# Patient Record
Sex: Female | Born: 1995 | Race: White | Hispanic: No | Marital: Single | Smoking: Never smoker
Health system: Southern US, Community
[De-identification: ages and names within clinical notes are randomized; demographics above are authoritative.]

## PROBLEM LIST (undated history)

## (undated) DIAGNOSIS — J45909 Unspecified asthma, uncomplicated: Secondary | ICD-10-CM

## (undated) DIAGNOSIS — R519 Headache, unspecified: Secondary | ICD-10-CM

## (undated) DIAGNOSIS — R51 Headache: Secondary | ICD-10-CM

## (undated) HISTORY — DX: Headache, unspecified: R51.9

## (undated) HISTORY — DX: Headache: R51

---
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
## 2017-11-29 ENCOUNTER — Other Ambulatory Visit: Payer: Self-pay

## 2017-11-29 ENCOUNTER — Encounter (HOSPITAL_COMMUNITY): Payer: Self-pay | Admitting: Emergency Medicine

## 2017-11-29 ENCOUNTER — Emergency Department (HOSPITAL_COMMUNITY)
Admission: EM | Admit: 2017-11-29 | Discharge: 2017-11-30 | Disposition: A | Discharge status: Home / Self Care | Payer: Medicaid Other | Attending: Emergency Medicine | Admitting: Emergency Medicine

## 2017-11-29 DIAGNOSIS — B9689 Other specified bacterial agents as the cause of diseases classified elsewhere: Secondary | ICD-10-CM

## 2017-11-29 DIAGNOSIS — N760 Acute vaginitis: Secondary | ICD-10-CM | POA: Diagnosis not present

## 2017-11-29 DIAGNOSIS — J45909 Unspecified asthma, uncomplicated: Secondary | ICD-10-CM | POA: Insufficient documentation

## 2017-11-29 DIAGNOSIS — B3731 Acute candidiasis of vulva and vagina: Secondary | ICD-10-CM

## 2017-11-29 DIAGNOSIS — N390 Urinary tract infection, site not specified: Secondary | ICD-10-CM

## 2017-11-29 DIAGNOSIS — B373 Candidiasis of vulva and vagina: Secondary | ICD-10-CM | POA: Diagnosis not present

## 2017-11-29 DIAGNOSIS — R1030 Lower abdominal pain, unspecified: Secondary | ICD-10-CM | POA: Diagnosis present

## 2017-11-29 HISTORY — DX: Unspecified asthma, uncomplicated: J45.909

## 2017-11-29 LAB — URINALYSIS, ROUTINE W REFLEX MICROSCOPIC
Bilirubin Urine: NEGATIVE
GLUCOSE, UA: NEGATIVE mg/dL
Ketones, ur: NEGATIVE mg/dL
Nitrite: NEGATIVE
PH: 7 (ref 5.0–8.0)
Protein, ur: NEGATIVE mg/dL
Specific Gravity, Urine: 1.012 (ref 1.005–1.030)

## 2017-11-29 LAB — COMPREHENSIVE METABOLIC PANEL
ALK PHOS: 89 U/L (ref 38–126)
ALT: 17 U/L (ref 0–44)
AST: 20 U/L (ref 15–41)
Albumin: 4.1 g/dL (ref 3.5–5.0)
Anion gap: 11 (ref 5–15)
BILIRUBIN TOTAL: 0.5 mg/dL (ref 0.3–1.2)
BUN: 13 mg/dL (ref 6–20)
CALCIUM: 9 mg/dL (ref 8.9–10.3)
CHLORIDE: 105 mmol/L (ref 98–111)
CO2: 23 mmol/L (ref 22–32)
CREATININE: 0.85 mg/dL (ref 0.44–1.00)
Glucose, Bld: 105 mg/dL — ABNORMAL HIGH (ref 70–99)
Potassium: 3.8 mmol/L (ref 3.5–5.1)
Sodium: 139 mmol/L (ref 135–145)
Total Protein: 7.3 g/dL (ref 6.5–8.1)

## 2017-11-29 LAB — LIPASE, BLOOD: LIPASE: 31 U/L (ref 11–51)

## 2017-11-29 LAB — CBC
HCT: 42.4 % (ref 36.0–46.0)
Hemoglobin: 13.5 g/dL (ref 12.0–15.0)
MCH: 26.6 pg (ref 26.0–34.0)
MCHC: 31.8 g/dL (ref 30.0–36.0)
MCV: 83.6 fL (ref 78.0–100.0)
PLATELETS: 377 10*3/uL (ref 150–400)
RBC: 5.07 MIL/uL (ref 3.87–5.11)
RDW: 16 % — AB (ref 11.5–15.5)
WBC: 15.2 10*3/uL — AB (ref 4.0–10.5)

## 2017-11-29 LAB — I-STAT BETA HCG BLOOD, ED (MC, WL, AP ONLY): I-stat hCG, quantitative: <5 m[IU]/mL (ref <5)

## 2017-11-29 NOTE — ED Triage Notes (Signed)
C/o L flank pain x 3 weeks.  Seen at [facility] ED and told she had pulled muscle.  States pain has not improved and now radiates around to both sides of abd.  Nausea and vomiting today.  Also reports frequent urination and burning with urination.

## 2017-11-30 LAB — GC/CHLAMYDIA PROBE AMP (~~LOC~~) NOT AT ARMC
Chlamydia: NEGATIVE
NEISSERIA GONORRHEA: NEGATIVE

## 2017-11-30 LAB — WET PREP, GENITAL
Sperm: NONE SEEN
TRICH WET PREP: NONE SEEN

## 2017-11-30 MED ORDER — METRONIDAZOLE 0.75 % VA GEL: 1.0000 | Freq: Two times a day (BID) | VAGINAL | 0 refills | Status: AC

## 2017-11-30 MED ORDER — CEPHALEXIN 250 MG PO CAPS
500.0000 mg | ORAL_CAPSULE | Freq: Once | ORAL | Status: AC
  Administered 2017-11-30: 500 mg via ORAL
  Filled 2017-11-30: qty 2

## 2017-11-30 MED ORDER — CEPHALEXIN 500 MG PO CAPS: 500.0000 mg | ORAL_CAPSULE | Freq: Four times a day (QID) | ORAL | 0 refills | Status: AC

## 2017-11-30 MED ORDER — FLUCONAZOLE 150 MG PO TABS: 150.0000 mg | ORAL_TABLET | Freq: Every day | ORAL | 1 refills | Status: AC

## 2017-11-30 NOTE — ED Provider Notes (Signed)
[facility]   CSN: [id number] Arrival date & time: 11/29/17  2142     History   Chief Complaint Chief Complaint  Patient presents with  . Flank Pain    HPI Alicia Ward is a 22 y.o. female.  Patient presents with pain that started in the left flank 3 weeks ago. In the last 1-2 days, the pain extended across lower abdomen and right flank. No fever. She vomited x 1 today without persistent nausea. She has urinary symptoms of dysuria and urinary frequency. She denies vaginal discharge. She has been able to eat and drink per her usual.   The history is provided by the patient. No language interpreter was used.    Past Medical History:  Diagnosis Date  . Asthma   . Headache     Patient Active Problem List   Diagnosis Date Noted  . Migraine with aura and without status migrainosus, not intractable 08/02/2014    Past Surgical History:  Procedure Laterality Date  . APPENDECTOMY  2013     OB History   None      Home Medications    Prior to Admission medications   Medication Sig Start Date End Date Taking? Authorizing Provider  norethindrone (MICRONOR,CAMILA,ERRIN) 0.35 MG tablet Take 1 tablet by mouth daily. 11/10/17  Yes [provider]  levETIRAcetam (KEPPRA) 250 MG tablet Take 1 tablet (250 mg total) by mouth 2 (two) times daily. Patient not taking: Reported on 11/30/2017 09/27/14 12/01/26  Anson FretAhern, Antonia B, MD  nortriptyline (PAMELOR) 10 MG capsule Take 2 capsules (20 mg total) by mouth at bedtime. Patient not taking: Reported on 11/30/2017 12/11/14   Anson FretAhern, Antonia B, MD  SUMAtriptan Succinate (ONZETRA XSAIL) 11 [facility] sprays into the nose once. Patient not taking: Reported on 11/30/2017 11/09/14   Anson FretAhern, Antonia B, MD  zolmitriptan (ZOMIG) 5 MG nasal solution Place 1 spray into the nose as needed for migraine. Patient not taking: Reported on 11/30/2017 11/09/14   Anson FretAhern, Antonia B, MD     Family History Family History  Problem Relation Age of Onset  . Migraines Neg Hx     Social History Social History   Tobacco Use  . Smoking status: Never Smoker  . Smokeless tobacco: Never Used  Substance Use Topics  . Alcohol use: No    Alcohol/week: 0.0 standard drinks  . Drug use: No     Allergies   Patient has no known allergies.   Review of Systems Review of Systems  Constitutional: Negative for fever.  Gastrointestinal: Positive for abdominal pain, nausea and vomiting. Negative for diarrhea.  Genitourinary: Positive for dysuria, flank pain and frequency. Negative for vaginal bleeding and vaginal discharge.  Musculoskeletal: Negative for myalgias.  Neurological: Negative for weakness.     Physical Exam Updated Vital Signs BP (!) 140/101   Pulse 94   Temp 99 F (37.2 C) (Oral)   Resp 16   SpO2 99%   Physical Exam  Constitutional: She is oriented to person, place, and time. She appears well-developed and well-nourished. No distress.  HENT:  Head: Normocephalic.  Neck: Normal range of motion. Neck supple.  Cardiovascular: Normal rate and regular rhythm.  Pulmonary/Chest: Effort normal and breath sounds normal.  Abdominal: Soft. Bowel sounds are normal. There is no tenderness. There is no rebound and no guarding.  Genitourinary:  Genitourinary Comments: Left flank CVA tenderness. There is white vaginal discharge in clumps. No vulvar redness or swelling. No  adnexal tenderness or CMT.   Musculoskeletal: Normal range of motion.  Neurological: She is alert and oriented to person, place, and time.  Skin: Skin is warm and dry. No rash noted.  Psychiatric: She has a normal mood and affect.     ED Treatments / Results  Labs (all labs ordered are listed, but only abnormal results are displayed) Labs Reviewed  WET PREP, GENITAL - Abnormal; Notable for the following components:      Result Value   Yeast Wet Prep HPF POC PRESENT (*)    Clue Cells Wet Prep  HPF POC PRESENT (*)    WBC, Wet Prep HPF POC MANY (*)    All other components within normal limits  COMPREHENSIVE METABOLIC PANEL - Abnormal; Notable for the following components:   Glucose, Bld 105 (*)    All other components within normal limits  CBC - Abnormal; Notable for the following components:   WBC 15.2 (*)    RDW 16.0 (*)    All other components within normal limits  URINALYSIS, ROUTINE W REFLEX MICROSCOPIC - Abnormal; Notable for the following components:   APPearance HAZY (*)    Hgb urine dipstick SMALL (*)    Leukocytes, UA SMALL (*)    Bacteria, UA FEW (*)    All other components within normal limits  LIPASE, BLOOD  I-STAT BETA HCG BLOOD, ED ([facility], [facility], [facility] ONLY)  GC/CHLAMYDIA PROBE AMP ([place]) NOT AT [facility]   Results for orders placed or performed during the hospital encounter of 11/29/17  Wet prep, genital  Result Value Ref Range   Yeast Wet Prep HPF POC PRESENT (A) NONE SEEN   Trich, Wet Prep NONE SEEN NONE SEEN   Clue Cells Wet Prep HPF POC PRESENT (A) NONE SEEN   WBC, Wet Prep HPF POC MANY (A) NONE SEEN   Sperm NONE SEEN   Lipase, blood  Result Value Ref Range   Lipase 31 11 - 51 U/L  Comprehensive metabolic panel  Result Value Ref Range   Sodium 139 135 - 145 mmol/L   Potassium 3.8 3.5 - 5.1 mmol/L   Chloride 105 98 - 111 mmol/L   CO2 23 22 - 32 mmol/L   Glucose, Bld 105 (H) 70 - 99 mg/dL   BUN 13 6 - 20 mg/dL   Creatinine, Ser 1.61 0.44 - 1.00 mg/dL   Calcium 9.0 8.9 - 09.6 mg/dL   Total Protein 7.3 6.5 - 8.1 g/dL   Albumin 4.1 3.5 - 5.0 g/dL   AST 20 15 - 41 U/L   ALT 17 0 - 44 U/L   Alkaline Phosphatase 89 38 - 126 U/L   Total Bilirubin 0.5 0.3 - 1.2 mg/dL   GFR calc non Af Amer >60 >60 mL/min   GFR calc Af Amer >60 >60 mL/min   Anion gap 11 5 - 15  CBC  Result Value Ref Range   WBC 15.2 (H) 4.0 - 10.5 K/uL   RBC 5.07 3.87 - 5.11 MIL/uL   Hemoglobin 13.5 12.0 - 15.0 g/dL   HCT 04.5 40.9 - 81.1 %   MCV 83.6 78.0 - 100.0 fL   MCH 26.6 26.0  - 34.0 pg   MCHC 31.8 30.0 - 36.0 g/dL   RDW 91.4 (H) 78.2 - 95.6 %   Platelets 377 150 - 400 K/uL  Urinalysis, Routine w reflex microscopic  Result Value Ref Range   Color, Urine YELLOW YELLOW   APPearance HAZY (A) CLEAR   Specific Gravity,  Urine 1.012 1.005 - 1.030   pH 7.0 5.0 - 8.0   Glucose, UA NEGATIVE NEGATIVE mg/dL   Hgb urine dipstick SMALL (A) NEGATIVE   Bilirubin Urine NEGATIVE NEGATIVE   Ketones, ur NEGATIVE NEGATIVE mg/dL   Protein, ur NEGATIVE NEGATIVE mg/dL   Nitrite NEGATIVE NEGATIVE   Leukocytes, UA SMALL (A) NEGATIVE   RBC / HPF 11-20 0 - 5 RBC/hpf   WBC, UA 21-50 0 - 5 WBC/hpf   Bacteria, UA FEW (A) NONE SEEN   Squamous Epithelial / LPF 0-5 0 - 5  I-Stat beta hCG blood, ED  Result Value Ref Range   I-stat hCG, quantitative <5.0 <5 mIU/mL   Comment 3            EKG None  Radiology No results found.  Procedures Procedures (including critical care time)  Medications Ordered in ED Medications - No data to display   Initial Impression / Assessment and Plan / ED Course  I have reviewed the triage vital signs and the nursing notes.  Pertinent labs & imaging results that were available during my care of the patient were reviewed by me and considered in my medical decision making (see chart for details).     Patient with pain that started x 3 weeks ago in left flank, now across lower abdomen and right flank. No fever. Vomiting x 1 today.   On exam, she has a mild leukocytosis of 15, otherwise blood tests are unremarkable. Wet prep shows vaginal yeast as well as BV. UA showing infection.   She is clinically well appearing. Will treat UTI with Keflex; BV with Metrogel and yeast with Diflucan. Discussed importance of PCP follow up. Patient is felt appropriate for discharge home.   Final Clinical Impressions(s) / ED Diagnoses   Final diagnoses:  None   1. UTI 2. BV 3. Vaginal yeast infection  ED Discharge Orders    None       Elpidio Anis,  Cordelia Poche 11/30/17 0622    Gilda Crease, MD 11/30/17 [phone]

## 2017-11-30 NOTE — Discharge Instructions (Addendum)
Take Keflex for urinary tract infection as directed. Use the Metrogel for vaginitis. You also have evidence of a vaginal yeast infection. Take Diflucan for this but you will need to wait until after you have completed the antibiotic. If you have persistent symptoms of vaginal discharge or vaginal itching for longer than one week, refill Diflucan and take a second dose. Follow up with your doctor for recheck in 1-2 weeks if still having symptoms. Return to the ED if you develop severe pain, high fever, uncontrolled vomiting or new concern.
